# Patient Record
Sex: Female | Born: 1955 | Race: Black or African American | Hispanic: No | Marital: Married | Smoking: Never smoker
Health system: Southern US, Community
[De-identification: ages and names within clinical notes are randomized; demographics above are authoritative.]

## PROBLEM LIST (undated history)

## (undated) DIAGNOSIS — I10 Essential (primary) hypertension: Secondary | ICD-10-CM

## (undated) HISTORY — PX: ABDOMINAL HYSTERECTOMY: SHX81

## (undated) HISTORY — PX: CHOLECYSTECTOMY: SHX55

---

## 1993-01-10 HISTORY — PX: BREAST EXCISIONAL BIOPSY: SUR124

## 2004-03-26 ENCOUNTER — Ambulatory Visit: Payer: Self-pay | Admitting: Family Medicine

## 2005-06-08 ENCOUNTER — Ambulatory Visit: Payer: Self-pay

## 2006-06-14 ENCOUNTER — Ambulatory Visit: Payer: Self-pay

## 2006-06-16 ENCOUNTER — Ambulatory Visit: Payer: Self-pay

## 2006-12-13 ENCOUNTER — Ambulatory Visit: Payer: Self-pay

## 2007-06-13 ENCOUNTER — Ambulatory Visit: Payer: Self-pay

## 2008-11-04 ENCOUNTER — Ambulatory Visit: Payer: Self-pay

## 2009-12-09 ENCOUNTER — Ambulatory Visit: Payer: Self-pay

## 2011-01-25 ENCOUNTER — Ambulatory Visit: Payer: Self-pay

## 2012-10-01 ENCOUNTER — Ambulatory Visit: Payer: Self-pay

## 2014-06-19 ENCOUNTER — Encounter: Payer: Self-pay | Admitting: Emergency Medicine

## 2014-06-19 ENCOUNTER — Emergency Department
Admission: EM | Admit: 2014-06-19 | Discharge: 2014-06-19 | Disposition: A | Discharge status: Home / Self Care | Payer: Self-pay | Attending: Emergency Medicine | Admitting: Emergency Medicine

## 2014-06-19 DIAGNOSIS — I10 Essential (primary) hypertension: Secondary | ICD-10-CM | POA: Insufficient documentation

## 2014-06-19 DIAGNOSIS — Z7729 Contact with and (suspected ) exposure to other hazardous substances: Secondary | ICD-10-CM | POA: Insufficient documentation

## 2014-06-19 DIAGNOSIS — R51 Headache: Secondary | ICD-10-CM | POA: Insufficient documentation

## 2014-06-19 HISTORY — DX: Essential (primary) hypertension: I10

## 2014-06-19 LAB — CARBOXYHEMOGLOBIN
CARBOXYHEMOGLOBIN: 1.2 % — AB (ref 1.5–9.0)
METHEMOGLOBIN: 1.2 %
Total oxygen content: 63.4 mL/dL

## 2014-06-19 NOTE — ED Provider Notes (Signed)
[facility] Emergency Department Provider Note  ____________________________________________  Time seen: 1202  I have reviewed the triage vital signs and the nursing notes.   HISTORY  Chief Complaint Toxic Inhalation   HPI Autumn Robles is a 59 y.o. female is here today with complaint of carbon monoxide poisoning. She states that her apartment has vents that are not placed in right.Her alarm in her apartment and her neighbor above both went off. The fire department was called. They were told to go to the emergency room if any problems. Patient states that she has had "flulike symptoms" along with a headache. She has remained in her home during this time. Yesterday she states that they disconnected the gas but the vents have not been repaired. She denies smoking in the past. Headache is a dull frontal headache with no radiation. She also had some cold symptoms.   Past Medical History  Diagnosis Date  . Hypertension     There are no active problems to display for this patient.   Past Surgical History  Procedure Laterality Date  . Cholecystectomy    . Abdominal hysterectomy      No current outpatient prescriptions on file.  Allergies Review of patient's allergies indicates no known allergies.  No family history on file.  Social History History  Substance Use Topics  . Smoking status: Never Smoker   . Smokeless tobacco: Not on file  . Alcohol Use: No    Review of Systems Constitutional: No fever/chills Eyes: No visual changes. ENT: No sore throat. Cardiovascular: Denies chest pain. Respiratory: Denies shortness of breath. Gastrointestinal: No abdominal pain.  No nausea, no vomiting.  No diarrhea.  No constipation. Genitourinary: Negative for dysuria. Musculoskeletal: Negative for back pain. Skin: Negative for rash. Neurological: Positive for headaches, no focal weakness or numbness.  10-point ROS otherwise  negative.  ____________________________________________   PHYSICAL EXAM:  VITAL SIGNS: ED Triage Vitals  Enc Vitals Group     BP 06/19/14 1122 124/83 mmHg     Pulse Rate 06/19/14 1122 70     Resp 06/19/14 1122 18     Temp 06/19/14 1122 98.3 F (36.8 C)     Temp Source 06/19/14 1122 Oral     SpO2 06/19/14 1122 96 %     Weight 06/19/14 1122 210 lb (95.255 kg)     Height 06/19/14 1122 5\' 5"  (1.651 m)     Head Cir --      Peak Flow --      Pain Score --      Pain Loc --      Pain Edu? --      Excl. in GC? --     Constitutional: Alert and oriented. Well appearing and in no acute distress. Eyes: Conjunctivae are normal. PERRL. EOMI. Head: Atraumatic. Nose: No congestion/rhinnorhea. Mouth/Throat: Mucous membranes are moist.  Oropharynx non-erythematous. Neck: No stridor.  Supple Hematological/Lymphatic/Immunilogical: No cervical lymphadenopathy. Cardiovascular: Normal rate, regular rhythm. Grossly normal heart sounds.  Good peripheral circulation. Respiratory: Normal respiratory effort.  No retractions. Lungs CTAB. Gastrointestinal: Soft and nontender. No distention Musculoskeletal: No lower extremity tenderness nor edema.  No joint effusions. Neurologic:  Normal speech and language. No gross focal neurologic deficits are appreciated. Speech is normal. No gait instability. Skin:  Skin is warm, dry and intact. No rash noted. Psychiatric: Mood and affect are normal. Speech and behavior are normal.  ____________________________________________   LABS (all labs ordered are listed, but only abnormal results are displayed)  Labs  Reviewed  CARBOXYHEMOGLOBIN - Abnormal; Notable for the following:    Carboxyhemoglobin 1.2 (*)    All other components within normal limits   PROCEDURES  Procedure(s) performed: None  Critical Care performed: No  ____________________________________________   INITIAL IMPRESSION / ASSESSMENT AND PLAN / ED COURSE  Pertinent labs & imaging  results that were available during my care of the patient were reviewed by me and considered in my medical decision making (see chart for details).  Patient was started on 2 L of oxygen by nasal cannula.  After approximate 45 minutes she states that her headache was completely resolved. Discussed going home and opening up all the windows in her apartment to live fresh air. She states that she has a mother place to stay until her vents are adequately repaired. ____________________________________________   FINAL CLINICAL IMPRESSION(S) / ED DIAGNOSES  Final diagnoses:  Exposure to carbon monoxide      Tommi Rumps, PA-C 06/19/14 1541  Phineas Semen, MD 06/19/14 [phone]

## 2014-06-19 NOTE — ED Notes (Signed)
Pt concerned of carbon monoxide poisoning bc the apartment she stays in has recently tested high for CM. Pt states she has been feeling "flu like symptoms" for about a week now.

## 2014-06-19 NOTE — Discharge Instructions (Signed)
Carbon Monoxide Poisoning Carbon monoxide poisoning is sickness from breathing in carbon monoxide gas. This is an emergency. You need to get medical help right away.  HOME CARE Do not return to the area where you breathed in the gas. The area must be completely aired out (ventilated) before it is safe to return. To prevent sickness in the future:  Put a carbon monoxide detector in your home.  Have all gas stoves and furnaces checked once a year.  Clean all fireplace chimneys and flues at least once a year.  Have the exhaust system in your car checked once a year.  Never keep a car's motor running in a closed garage.  Never sleep in a car with the motor running.  Make sure all rooms that are heated with gas, coal, charcoal, or wood are aired out well. GET HELP RIGHT AWAY IF: You think a person has breathed in carbon monoxide gas. Remove the person from the area right away. Call your local emergency services (911 in [place] Begin rescue breathing if you cannot wake up the person (unconscious). MAKE SURE YOU:  Understand these instructions.  Will watch your condition.  Will get help right away if you are not doing well or get worse. Document Released: 06/28/2011 Document Reviewed: 06/28/2011 [facility] Patient Information 2015 [place], [place]. This information is not intended to replace advice given to you by your health care provider. Make sure you discuss any questions you have with your health care provider.   OPEN THE WINDOWS IN YOUR APARTMENT TO GET LOTS OF FRESH AIR IN YOUR ROOMS FOLLOW UP WITH YOUR DOCTOR IF ANY CONTINUED PROBLEMS

## 2015-01-26 ENCOUNTER — Ambulatory Visit: Payer: Self-pay | Attending: Oncology | Admitting: *Deleted

## 2015-01-26 ENCOUNTER — Encounter: Payer: Self-pay | Admitting: *Deleted

## 2015-01-26 ENCOUNTER — Other Ambulatory Visit: Payer: Self-pay | Admitting: *Deleted

## 2015-01-26 ENCOUNTER — Ambulatory Visit
Admission: RE | Admit: 2015-01-26 | Discharge: 2015-01-26 | Disposition: A | Discharge status: Home / Self Care | Payer: Self-pay | Source: Ambulatory Visit | Attending: Oncology | Admitting: Oncology

## 2015-01-26 VITALS — BP 111/75 | HR 72 | Temp 97.5°F | Ht 66.14 in | Wt 219.4 lb

## 2015-01-26 DIAGNOSIS — N63 Unspecified lump in unspecified breast: Secondary | ICD-10-CM

## 2015-01-26 DIAGNOSIS — Z0000 Encounter for general adult medical examination without abnormal findings: Secondary | ICD-10-CM

## 2015-01-26 NOTE — Patient Instructions (Signed)
Gave patient hand-out, Women Staying Healthy, Active and Well from [facility], with education on breast health, pap smears, heart and colon health. 

## 2015-01-26 NOTE — Progress Notes (Signed)
Subjective:     Patient ID: Autumn Robles, female   DOB: 1956/01/04, 60 y.o.   MRN: [id number]  HPI   Review of Systems     Objective:   Physical Exam  Pulmonary/Chest: Right breast exhibits no inverted nipple, no mass, no nipple discharge, no skin change and no tenderness. Left breast exhibits no inverted nipple, no mass, no nipple discharge, no skin change and no tenderness. Breasts are symmetrical.    Scar from previous left breast biopsy       Assessment:     60 [facility] Black [facility]  Clinical breast exam unremarkable.  Taught self breast awareness.  Patient has been screened for eligibility.  She does not have any insurance, Medicare or Medicaid.  She also meets financial eligibility.  Hand-out given on the Affordable Care Act.     Plan:     Screening mammogram ordered.  Will follow - up per [facility] protocol.

## 2015-02-12 ENCOUNTER — Ambulatory Visit
Admission: RE | Admit: 2015-02-12 | Discharge: 2015-02-12 | Disposition: A | Discharge status: Home / Self Care | Payer: Self-pay | Source: Ambulatory Visit | Attending: Oncology | Admitting: Oncology

## 2015-02-12 DIAGNOSIS — N63 Unspecified lump in unspecified breast: Secondary | ICD-10-CM

## 2015-02-16 ENCOUNTER — Encounter: Payer: Self-pay | Admitting: *Deleted

## 2015-02-16 ENCOUNTER — Other Ambulatory Visit: Payer: Self-pay | Admitting: *Deleted

## 2015-02-16 DIAGNOSIS — N63 Unspecified lump in unspecified breast: Secondary | ICD-10-CM

## 2015-02-16 NOTE — Progress Notes (Signed)
Letter mailed to inform patient of her appointment for her 6 month follow-up mammogram on 08/18/15 @ 9:20 at [place].  HSIS to [place].

## 2015-06-12 ENCOUNTER — Emergency Department
Admission: EM | Admit: 2015-06-12 | Discharge: 2015-06-12 | Disposition: A | Discharge status: Home / Self Care | Payer: Self-pay | Attending: Emergency Medicine | Admitting: Emergency Medicine

## 2015-06-12 ENCOUNTER — Encounter: Payer: Self-pay | Admitting: Emergency Medicine

## 2015-06-12 DIAGNOSIS — I10 Essential (primary) hypertension: Secondary | ICD-10-CM | POA: Insufficient documentation

## 2015-06-12 DIAGNOSIS — M7521 Bicipital tendinitis, right shoulder: Secondary | ICD-10-CM | POA: Insufficient documentation

## 2015-06-12 MED ORDER — MELOXICAM 15 MG PO TABS: 15.0000 mg | ORAL_TABLET | Freq: Every day | ORAL | Status: AC

## 2015-06-12 MED ORDER — TRAMADOL HCL 50 MG PO TABS: 50.0000 mg | ORAL_TABLET | Freq: Four times a day (QID) | ORAL | Status: AC | PRN

## 2015-06-12 MED ORDER — PREDNISONE 10 MG (21) PO TBPK: ORAL_TABLET | ORAL | Status: DC

## 2015-06-12 MED ORDER — PREDNISONE 20 MG PO TABS
ORAL_TABLET | ORAL | Status: AC
  Administered 2015-06-12: 60 mg via ORAL
  Filled 2015-06-12: qty 3

## 2015-06-12 MED ORDER — PREDNISONE 20 MG PO TABS
60.0000 mg | ORAL_TABLET | Freq: Once | ORAL | Status: AC
  Administered 2015-06-12: 60 mg via ORAL

## 2015-06-12 NOTE — Discharge Instructions (Signed)
Bicipital Tendonitis Bicipital tendonitis refers to redness, soreness, and swelling (inflammation) or irritation of the bicep tendon. The biceps muscle is located between the elbow and shoulder of the inner arm. The tendon heads, similar to pieces of rope, connect the bicep muscle to the shoulder socket. They are called short head and long head tendons. When tendonitis occurs, the long head tendon is inflamed and swollen, and may be thickened or partially torn.  Bicipital tendonitis can occur with other problems as well, such as arthritis in the shoulder or acromioclavicular joints, tears in the tendons, or other rotator cuff problems.  CAUSES  Overuse of of the arms for overhead activities is the major cause of tendonitis. Many athletes, such as swimmers, baseball players, and tennis players are prone to bicipital tendonitis. Jobs that require manual labor or routine chores, especially chores involving overhead activities can result in overuse and tendonitis. SYMPTOMS Symptoms may include:  Pain in and around the front of the shoulder. Pain may be worse with overhead motion.  Pain or aching that radiates down the arm.  Clicking or shifting sensations in the shoulder. DIAGNOSIS Your caregiver may perform the following:  Physical exam and tests of the biceps and shoulder to observe range of motion, strength, and stability.  X-rays or magnetic resonance imaging (MRI) to confirm the diagnosis. In most common cases, these tests are not necessary. Since other problems may exist in the shoulder or rotator cuff, additional tests may be recommended. TREATMENT Treatment may include the following:  Medications  Your caregiver may prescribe over-the-counter pain relievers.  Steroid injections, such as cortisone, may be recommended. These may help to reduce inflammation and pain.  Physical Therapy - Your caregiver may recommend gentle exercises with the arm. These can help restore strength and range  of motion. They may be done at home or with a physical therapist's supervision and input.  Surgery - Arthroscopic or open surgery sometimes is necessary. Surgery may include:  Reattachment or repair of the tendon at the shoulder socket.  Removal of the damaged section of the tendon.  Anchoring the tendon to a different area of the shoulder (tenodesis). HOME CARE INSTRUCTIONS   Avoid overhead motion of the affected arm or any other motion that causes pain.  Take medication for pain as directed. Do not take these for more than 3 weeks, unless directed to do so by your caregiver.  Ice the affected area for 20 minutes at a time, 3-4 times per day. Place a towel on the skin over the painful area and the ice or cold pack over the towel. Do not place ice directly on the skin.  Perform gentle exercises at home as directed. These will increase strength and flexibility. PREVENTION  Modify your activities as much as possible to protect your arm. A physical therapist or sports medicine physician can help you understand options for safe motion.  Avoid repetitive overhead pulling, lifting, reaching, and throwing until your caregiver tells you it is ok to resume these activities. SEEK MEDICAL CARE IF:  Your pain worsens.  You have difficulty moving the affected arm.  You have trouble performing any of the self-care instructions. MAKE SURE YOU:   Understand these instructions.  Will watch your condition.  Will get help right away if you are not doing well or get worse.   This information is not intended to replace advice given to you by your health care provider. Make sure you discuss any questions you have with your health care provider.     Document Released: 01/29/2010 Document Revised: 03/21/2011 Document Reviewed: 07/16/2014 Elsevier Interactive Patient Education 2016 [employer]  

## 2015-06-12 NOTE — ED Provider Notes (Signed)
[facility] Emergency Department Provider Note ____________________________________________  Time seen: Approximately 9:17 PM  I have reviewed the triage vital signs and the nursing notes.   HISTORY  Chief Complaint Extremity Pain and Arm Injury    HPI Autumn Robles is a 60 y.o. female who presents to the emergency department for evaluation of right upper arm pain. Certain movements cause excruciating pain. She states that she is not taking any over-the-counter medications for pain. She states that the pain is in the middle of the right upper arm and denies radiation, numbness, tingling.  Past Medical History  Diagnosis Date  . Hypertension     There are no active problems to display for this patient.   Past Surgical History  Procedure Laterality Date  . Cholecystectomy    . Abdominal hysterectomy    . Breast biopsy Left     benign    Current Outpatient Rx  Name  Route  Sig  Dispense  Refill  . meloxicam (MOBIC) 15 MG tablet   Oral   Take 1 tablet (15 mg total) by mouth daily.   30 tablet   0   . predniSONE (STERAPRED UNI-PAK 21 TAB) 10 MG (21) TBPK tablet      Take 6 tablets on day 1 Take 5 tablets on day 2 Take 4 tablets on day 3 Take 3 tablets on day 4 Take 2 tablets on day 5 Take 1 tablet on day 6   21 tablet   0   . traMADol (ULTRAM) 50 MG tablet   Oral   Take 1 tablet (50 mg total) by mouth every 6 (six) hours as needed.   12 tablet   0     Allergies Review of patient's allergies indicates no known allergies.  History reviewed. No pertinent family history.  Social History Social History  Substance Use Topics  . Smoking status: Never Smoker   . Smokeless tobacco: None  . Alcohol Use: No    Review of Systems Constitutional: No recent illness. Cardiovascular: Denies chest pain or palpitations. Respiratory: Denies shortness of breath. Musculoskeletal: Pain in Right upper arm Skin: Negative for rash, wound,  lesion. Neurological: Negative for focal weakness or numbness.  ____________________________________________   PHYSICAL EXAM:  VITAL SIGNS: ED Triage Vitals  Enc Vitals Group     BP 06/12/15 1922 140/81 mmHg     Pulse Rate 06/12/15 1922 74     Resp 06/12/15 1922 18     Temp 06/12/15 1922 98.2 F (36.8 C)     Temp Source 06/12/15 1922 Oral     SpO2 06/12/15 1922 97 %     Weight 06/12/15 1922 215 lb (97.523 kg)     Height 06/12/15 1922 5\' 5"  (1.651 m)     Head Cir --      Peak Flow --      Pain Score 06/12/15 1922 0     Pain Loc --      Pain Edu? --      Excl. in GC? --     Constitutional: Alert and oriented. Well appearing and in no acute distress. Eyes: Conjunctivae are normal. EOMI. Head: Atraumatic. Neck: No stridor.  Respiratory: Normal respiratory effort.   Musculoskeletal: Tenderness is noted in the proximal biceps of the right arm with full extension of the arm, external and internal rotation of the arm. Normal range of motion of the shoulder, elbow, and wrist. Neurologic:  Normal speech and language. No gross focal neurologic deficits are  appreciated. Speech is normal. No gait instability. Skin:  Skin is warm, dry and intact. Atraumatic. Psychiatric: Mood and affect are normal. Speech and behavior are normal.  ____________________________________________   LABS (all labs ordered are listed, but only abnormal results are displayed)  Labs Reviewed - No data to display ____________________________________________  RADIOLOGY  Not indicated ____________________________________________   PROCEDURES  Procedure(s) performed: None   ____________________________________________   INITIAL IMPRESSION / ASSESSMENT AND PLAN / ED COURSE  Pertinent labs & imaging results that were available during my care of the patient were reviewed by me and considered in my medical decision making (see chart for details).  Patient was given prescriptions for meloxicam,  tramadol, and prednisone. She was encouraged to follow up with orthopedics for symptoms that are not improving over the week. She was encouraged to return to the emergency department for symptoms that change or worsen if she is unable schedule an appointment. ____________________________________________   FINAL CLINICAL IMPRESSION(S) / ED DIAGNOSES  Final diagnoses:  Biceps tendonitis, right       Chinita Pester, FNP 06/12/15 2120  Loleta Rose, MD 06/12/15 2222

## 2015-06-12 NOTE — ED Notes (Signed)
Pt ambulatory to triage without difficulty c/o pain in right upper arm indicate to bicep with specific movements (10/10), but 0/10 pain with no movement or activity.  Pain reported for the last month, pt denies specific event that started pain.    Pt denies further s/sx, reports hx of HTN and compliancy.

## 2015-08-18 ENCOUNTER — Ambulatory Visit
Admission: RE | Admit: 2015-08-18 | Discharge: 2015-08-18 | Disposition: A | Discharge status: Home / Self Care | Payer: Self-pay | Source: Ambulatory Visit | Attending: Oncology | Admitting: Oncology

## 2015-08-18 DIAGNOSIS — N63 Unspecified lump in unspecified breast: Secondary | ICD-10-CM

## 2015-08-25 ENCOUNTER — Encounter: Payer: Self-pay | Admitting: *Deleted

## 2015-08-25 NOTE — Progress Notes (Signed)
Letter mailed from [facility] to inform patient of her normal mammogram results.  Patient is to follow-up with annual screening in 6 months.  HSIS to [place]. 

## 2016-03-14 ENCOUNTER — Encounter: Payer: Self-pay | Admitting: *Deleted

## 2016-03-14 ENCOUNTER — Encounter (INDEPENDENT_AMBULATORY_CARE_PROVIDER_SITE_OTHER): Payer: Self-pay

## 2016-03-14 ENCOUNTER — Ambulatory Visit
Admission: RE | Admit: 2016-03-14 | Discharge: 2016-03-14 | Disposition: A | Discharge status: Home / Self Care | Payer: Self-pay | Source: Ambulatory Visit | Attending: Oncology | Admitting: Oncology

## 2016-03-14 ENCOUNTER — Ambulatory Visit: Payer: Self-pay | Attending: Oncology | Admitting: *Deleted

## 2016-03-14 VITALS — BP 144/88 | HR 76 | Temp 97.8°F | Ht 66.54 in | Wt 233.8 lb

## 2016-03-14 DIAGNOSIS — Z0000 Encounter for general adult medical examination without abnormal findings: Secondary | ICD-10-CM

## 2016-03-14 NOTE — Patient Instructions (Signed)
Gave patient hand-out, Women Staying Healthy, Active and Well from [facility], with education on breast health, pap smears, heart and colon health. 

## 2016-03-14 NOTE — Progress Notes (Signed)
Subjective:     Patient ID: Autumn Robles, female   DOB: 12-Jun-1955, 61 y.o.   MRN: [id number]  HPI   Review of Systems     Objective:   Physical Exam  Pulmonary/Chest: Right breast exhibits no inverted nipple, no mass, no nipple discharge, no skin change and no tenderness. Left breast exhibits no inverted nipple, no mass, no nipple discharge, no skin change and no tenderness. Breasts are symmetrical.         Assessment:     61 [facility] Black [facility]  Clinical breast exam unremarkable.  Taught self breast awareness.  Patient with history of hysterectomy.  No pap per protocol. Patient has been screened for eligibility.  She does not have any insurance, Medicare or Medicaid.  She also meets financial eligibility.  Hand-out given on the Affordable Care Act.    Plan:     Screening mammogram ordered.  Will follow up per [facility] protocol.

## 2016-03-30 ENCOUNTER — Encounter: Payer: Self-pay | Admitting: *Deleted

## 2016-03-30 NOTE — Progress Notes (Signed)
Letter mailed from [facility] to inform patient of her normal mammogram results.  Patient is to follow-up with annual screening in one year.  HSIS to [place]. 

## 2017-06-04 IMAGING — MG MM DIGITAL SCREENING BILAT W/ CAD
5 series · 5 of 5 positions shown · non-contrast
Comparison: Previous exam(s).

CLINICAL DATA: Screening.

EXAM:
DIGITAL SCREENING BILATERAL MAMMOGRAM WITH CAD

[L MLO]
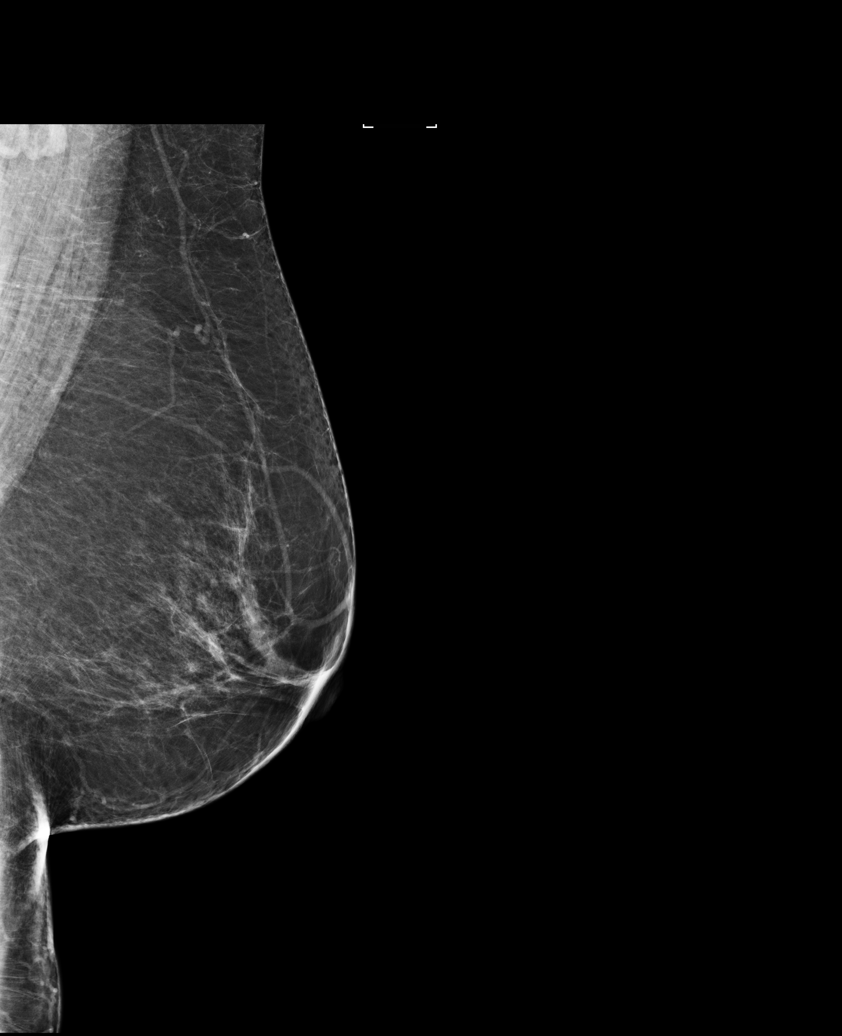

[R CC]
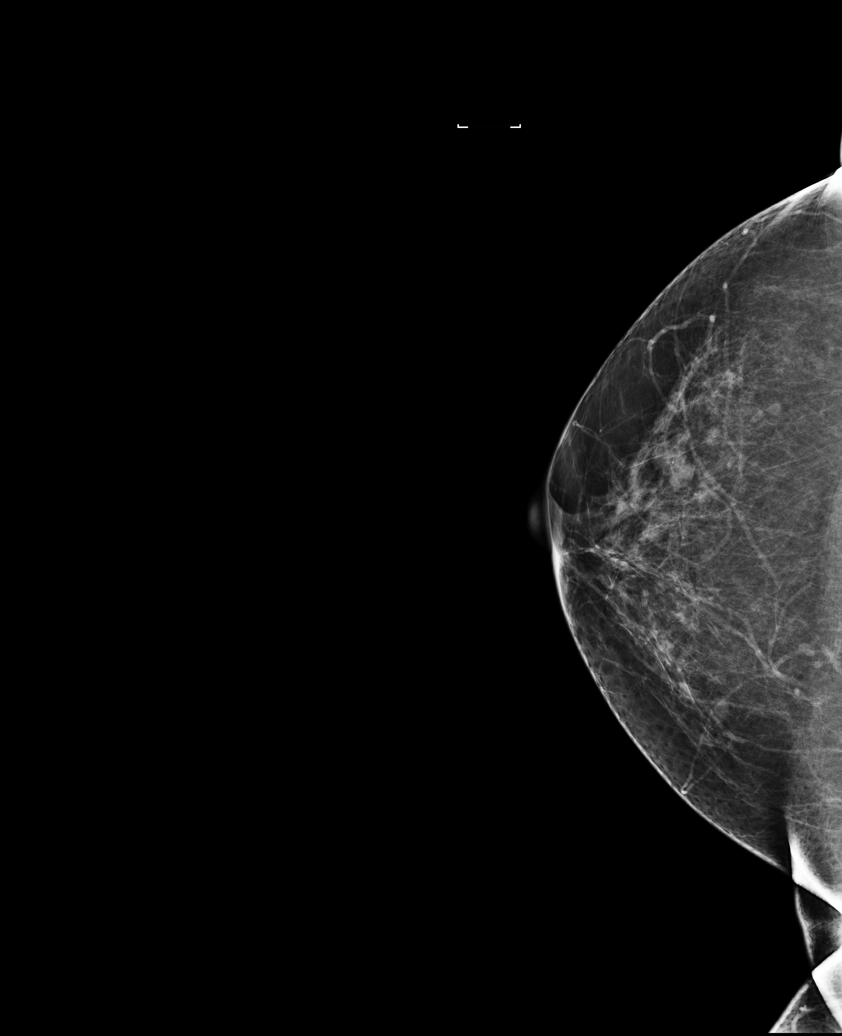

[R MLO (1 of 2)]
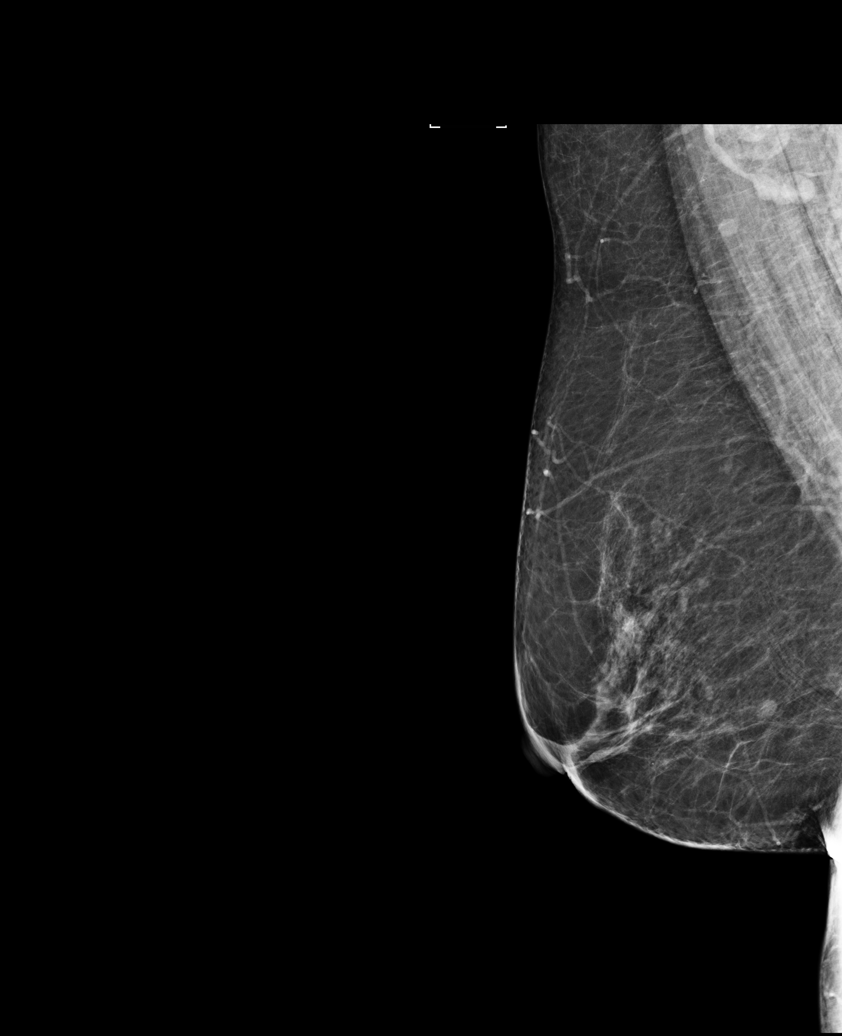

[L CC]
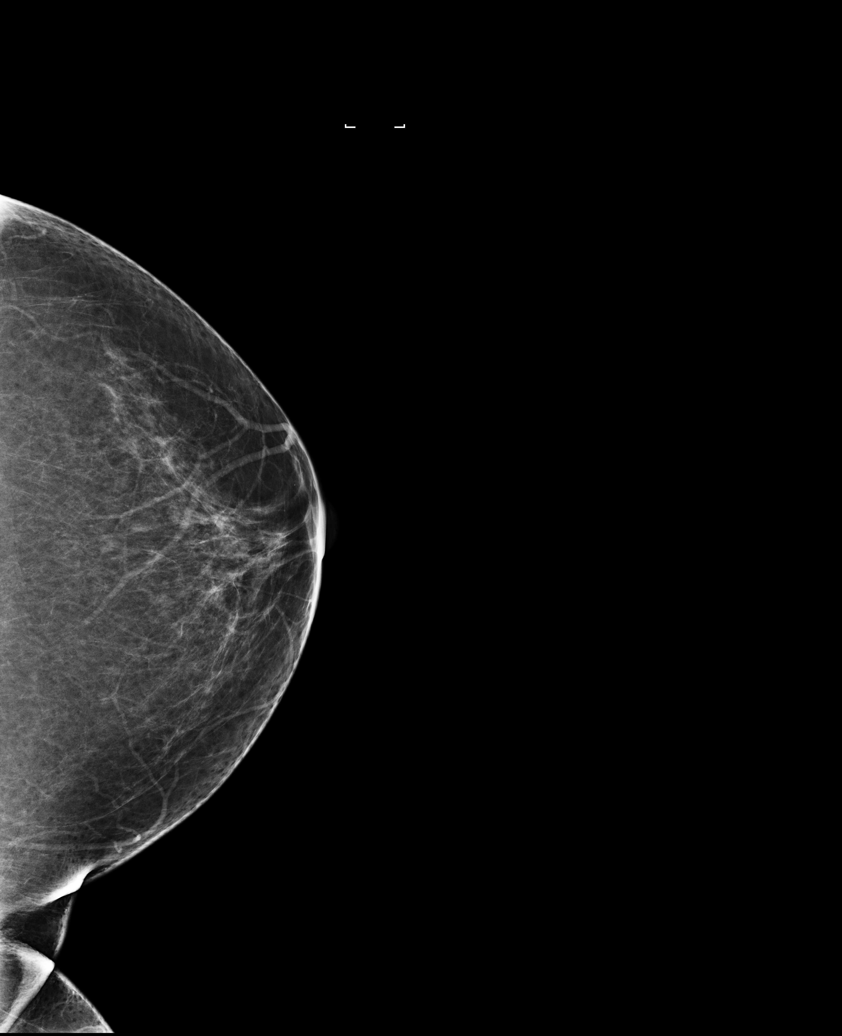

[R MLO (2 of 2)]
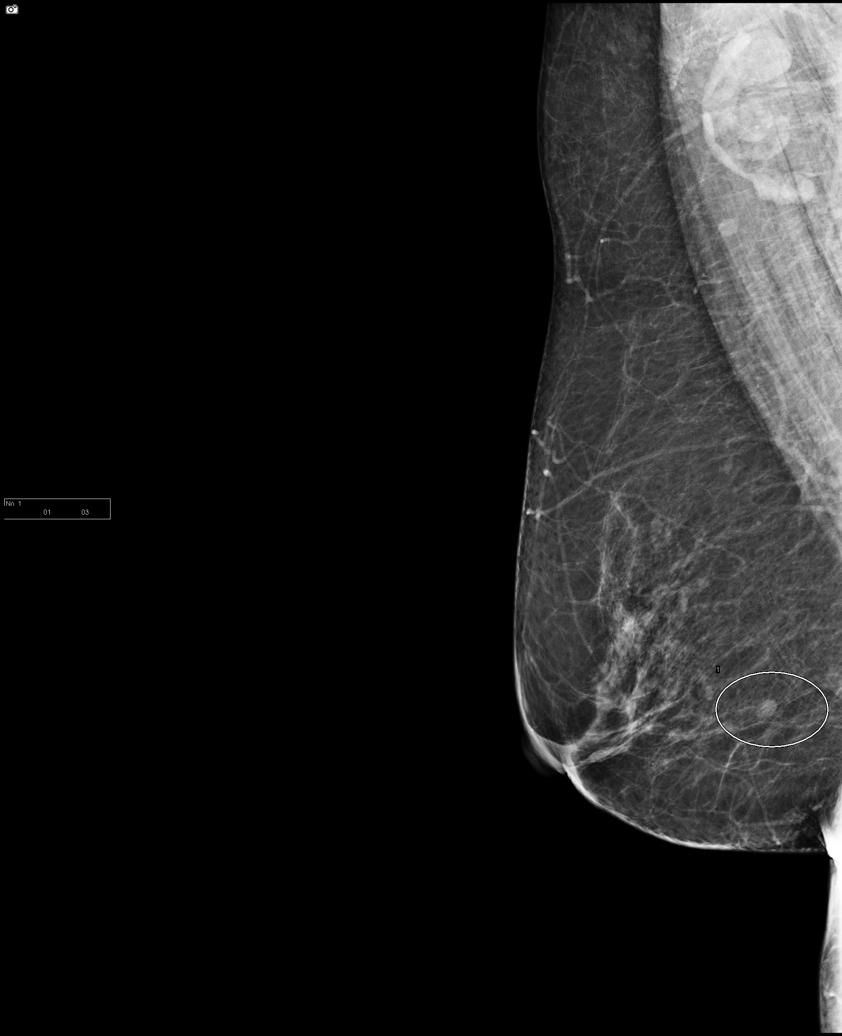

[5 of 5 positions shown; findings below may reference images not displayed]

ACR Breast Density Category b: There are scattered areas of
fibroglandular density.
FINDINGS: In the right breast, a possible mass warrants further evaluation. In
the left breast, no findings suspicious for malignancy. Images were
processed with CAD.
IMPRESSION: Further evaluation is suggested for possible mass in the right
breast.

RECOMMENDATION:
Diagnostic mammogram and possibly ultrasound of the right breast.
(Code:[id number])

The patient will be contacted regarding the findings, and additional
imaging will be scheduled.

BI-RADS CATEGORY  0: Incomplete. Need additional imaging evaluation
and/or prior mammograms for comparison.

## 2018-05-07 ENCOUNTER — Ambulatory Visit: Payer: Self-pay

## 2019-03-30 ENCOUNTER — Ambulatory Visit: Payer: Self-pay | Attending: Internal Medicine

## 2019-03-30 DIAGNOSIS — Z23 Encounter for immunization: Secondary | ICD-10-CM

## 2019-03-30 NOTE — Progress Notes (Signed)
   Covid-19 Vaccination Clinic  Name:  Autumn Robles    MRN: [id number] DOB: Sep 24, 1955  03/30/2019  [name] was observed post Covid-19 immunization for 15 minutes without incident. She was provided with Vaccine Information Sheet and instruction to access the V-Safe system.   [name] was instructed to call 911 with any severe reactions post vaccine: Marland Kitchen Difficulty breathing  . Swelling of face and throat  . A fast heartbeat  . A bad rash all over body  . Dizziness and weakness   Immunizations Administered    Name Date Dose VIS Date Route   Pfizer COVID-19 Vaccine 03/30/2019  9:16 AM 0.3 mL 12/21/2018 Intramuscular   Manufacturer: [employer], [employer]   Lot: [id number]   NDC: [address]-7

## 2019-04-23 ENCOUNTER — Ambulatory Visit: Payer: Self-pay | Attending: Internal Medicine

## 2019-04-23 DIAGNOSIS — Z23 Encounter for immunization: Secondary | ICD-10-CM

## 2019-04-23 NOTE — Progress Notes (Signed)
   Covid-19 Vaccination Clinic  Name:  Autumn Robles    MRN: [id number] DOB: 06-Oct-1955  04/23/2019  [name] was observed post Covid-19 immunization for 15 minutes without incident. She was provided with Vaccine Information Sheet and instruction to access the V-Safe system.   [name] was instructed to call 911 with any severe reactions post vaccine: Marland Kitchen Difficulty breathing  . Swelling of face and throat  . A fast heartbeat  . A bad rash all over body  . Dizziness and weakness   Immunizations Administered    Name Date Dose VIS Date Route   Pfizer COVID-19 Vaccine 04/23/2019 12:25 PM 0.3 mL 12/21/2018 Intramuscular   Manufacturer: [employer], [employer]   Lot: [id number]   NDC: [address]-8

## 2019-11-05 ENCOUNTER — Ambulatory Visit (LOCAL_COMMUNITY_HEALTH_CENTER): Payer: Self-pay

## 2019-11-05 ENCOUNTER — Other Ambulatory Visit: Payer: Self-pay

## 2019-11-05 DIAGNOSIS — Z23 Encounter for immunization: Secondary | ICD-10-CM

## 2019-12-03 ENCOUNTER — Ambulatory Visit
Admission: RE | Admit: 2019-12-03 | Discharge: 2019-12-03 | Disposition: A | Discharge status: Home / Self Care | Payer: Self-pay | Source: Ambulatory Visit | Attending: Oncology | Admitting: Oncology

## 2019-12-03 ENCOUNTER — Encounter: Payer: Self-pay | Admitting: *Deleted

## 2019-12-03 ENCOUNTER — Ambulatory Visit: Payer: Self-pay | Attending: Oncology | Admitting: *Deleted

## 2019-12-03 ENCOUNTER — Other Ambulatory Visit: Payer: Self-pay

## 2019-12-03 VITALS — BP 121/80 | HR 72 | Temp 98.2°F | Ht 67.0 in | Wt 213.3 lb

## 2019-12-03 DIAGNOSIS — Z0000 Encounter for general adult medical examination without abnormal findings: Secondary | ICD-10-CM

## 2019-12-03 NOTE — Progress Notes (Signed)
  Subjective:     Patient ID: Autumn Robles, female   DOB: 11/28/1955, 64 y.o.   MRN: [id number]  HPI   [facility] Medical History Record - 12/02/19 1640      Breast History   Screening cycle New    Provider ([facility]    Initial Mammogram 12/03/19    Last Mammogram Annual    Last Mammogram Date 03/14/16    Provider (Mammogram)  [facility]    Recent Breast Symptoms None      Breast Cancer History   Breast Cancer History No personal or family history      Previous History of Breast Problems   Breast Surgery or Biopsy Left   benign   Breast Implants N/A    BSE Done Monthly      Gynecological/Obstetrical History   LMP --   1998   Is there any chance that the client could be pregnant?  No    Age at menarche 40    PAP smear history Annually    Date of last PAP  --   hysterectomy 1998   Age at first live birth 68    Breast fed children No    DES Exposure No    Cervical, Uterine or Ovarian cancer No    Family history of Cervial, Uterine or Ovarian cancer No    Hysterectomy Yes    Cervix removed Yes    Ovaries removed No    Laser/Cryosurgery No    Current method of birth control None    Current method of Estrogen/Hormone replacement None    Smoking history None             Review of Systems     Objective:   Physical Exam Chest:     Breasts: Breasts are symmetrical.        Right: No swelling, bleeding, inverted nipple, mass, nipple discharge, skin change or tenderness.        Left: No swelling, bleeding, inverted nipple, mass, nipple discharge, skin change or tenderness.  Lymphadenopathy:     Upper Body:     Right upper body: No supraclavicular or axillary adenopathy.     Left upper body: No supraclavicular or axillary adenopathy.        Assessment:     64 year old female returns to [facility] for annual screening.  Clinical breast unremarkable.  Taught self breast awareness.  Patient has a history of hysterectomy.  Pap omitted per protocol.  Patient has  been screened for eligibility.  She does not have any insurance, Medicare or Medicaid.  She also meets financial eligibility.   Risk Assessment    Risk Scores      12/03/2019   Last edited by: Scarlett Presto, RN   5-year risk: 1.6 %   Lifetime risk: 6.1 %            Plan:     Screening mammogram ordered.  Will follow up per [facility] protocol.

## 2019-12-12 ENCOUNTER — Encounter: Payer: Self-pay | Admitting: *Deleted

## 2019-12-12 NOTE — Progress Notes (Signed)
Letter mailed from [facility] to inform patient of her normal mammogram results.  Patient is to follow-up with annual screening in one year. 

## 2020-09-29 ENCOUNTER — Other Ambulatory Visit: Payer: Self-pay | Admitting: Primary Care

## 2020-09-29 DIAGNOSIS — Z1231 Encounter for screening mammogram for malignant neoplasm of breast: Secondary | ICD-10-CM

## 2020-12-07 ENCOUNTER — Other Ambulatory Visit: Payer: Self-pay

## 2020-12-07 ENCOUNTER — Ambulatory Visit
Admission: RE | Admit: 2020-12-07 | Discharge: 2020-12-07 | Disposition: A | Discharge status: Home / Self Care | Payer: Medicare Other | Source: Ambulatory Visit | Attending: Primary Care | Admitting: Primary Care

## 2020-12-07 DIAGNOSIS — Z1231 Encounter for screening mammogram for malignant neoplasm of breast: Secondary | ICD-10-CM | POA: Insufficient documentation

## 2021-11-15 ENCOUNTER — Encounter: Payer: Self-pay | Admitting: Primary Care

## 2022-04-11 ENCOUNTER — Ambulatory Visit
Admission: EM | Admit: 2022-04-11 | Discharge: 2022-04-11 | Disposition: A | Discharge status: Home / Self Care | Payer: 59

## 2022-04-11 DIAGNOSIS — R051 Acute cough: Secondary | ICD-10-CM

## 2022-04-11 DIAGNOSIS — J069 Acute upper respiratory infection, unspecified: Secondary | ICD-10-CM

## 2022-04-11 MED ORDER — BENZONATATE 100 MG PO CAPS: 200.0000 mg | ORAL_CAPSULE | Freq: Three times a day (TID) | ORAL | 0 refills | Status: DC

## 2022-04-11 MED ORDER — IPRATROPIUM BROMIDE 0.06 % NA SOLN: 2.0000 | Freq: Four times a day (QID) | NASAL | 12 refills | Status: AC

## 2022-04-11 MED ORDER — AMOXICILLIN-POT CLAVULANATE 875-125 MG PO TABS: 1.0000 | ORAL_TABLET | Freq: Two times a day (BID) | ORAL | 0 refills | Status: DC

## 2022-04-11 MED ORDER — PROMETHAZINE-DM 6.25-15 MG/5ML PO SYRP: 5.0000 mL | ORAL_SOLUTION | Freq: Four times a day (QID) | ORAL | 0 refills | Status: DC | PRN

## 2022-04-11 NOTE — Discharge Instructions (Signed)
The Augmentin twice daily with food for 10 days for treatment of your URI. ° °Perform sinus irrigation 2-3 times a day with a NeilMed sinus rinse kit and distilled water.  Do not use tap water. ° °You can use plain over-the-counter Mucinex every 6 hours to break up the stickiness of the mucus so your body can clear it. ° °Increase your oral fluid intake to thin out your mucus so that is also able for your body to clear more easily. ° °Take an over-the-counter probiotic, such as Culturelle-align-activia, 1 hour after each dose of antibiotic to prevent diarrhea. ° °Use the Atrovent nasal spray, 2 squirts in each nostril every 6 hours, as needed for runny nose and postnasal drip. ° °Use the Tessalon Perles every 8 hours during the day.  Take them with a small sip of water.  They may give you some numbness to the base of your tongue or a metallic taste in your mouth, this is normal. ° °Use the Promethazine DM cough syrup at bedtime for cough and congestion.  It will make you drowsy so do not take it during the day. ° °If you develop any new or worsening symptoms return for reevaluation or see your primary care provider.  °

## 2022-04-11 NOTE — ED Triage Notes (Addendum)
Pt c/o sore throat & congestion onset x1 week ago. Pt states she has been taking mucinex for sxs,is helping some. Pt reports negative home covid test last Sunday

## 2022-04-11 NOTE — ED Provider Notes (Signed)
[facility]    CSN: [id number] Arrival date & time: 04/11/22  1024      History   Chief Complaint Chief Complaint  Patient presents with   Sore Throat   Nasal Congestion    HPI Autumn Robles is a 67 y.o. female.   HPI  67 year old female with significant past medical history for hypertension presents for evaluation of sore throat and congestion x 1 week.  She has been using over-the-counter Mucinex for her symptoms with some help.  She took a home COVID test that was negative.  Past Medical History:  Diagnosis Date   Hypertension     There are no problems to display for this patient.   Past Surgical History:  Procedure Laterality Date   ABDOMINAL HYSTERECTOMY     BREAST EXCISIONAL BIOPSY Left 1995   benign   CHOLECYSTECTOMY      OB History   No obstetric history on file.      Home Medications    Prior to Admission medications   Medication Sig Start Date End Date Taking? Authorizing Provider  amoxicillin-clavulanate (AUGMENTIN) 875-125 MG tablet Take 1 tablet by mouth every 12 (twelve) hours for 10 days. 04/11/22 04/21/22 Yes Margarette Canada, NP  benzonatate (TESSALON) 100 MG capsule Take 2 capsules (200 mg total) by mouth every 8 (eight) hours. 04/11/22  Yes Margarette Canada, NP  hydrochlorothiazide (HYDRODIURIL) 25 MG tablet Take 25 mg by mouth daily.   Yes [provider]  ipratropium (ATROVENT) 0.06 % nasal spray Place 2 sprays into both nostrils 4 (four) times daily. 04/11/22  Yes Margarette Canada, NP  losartan (COZAAR) 25 MG tablet Take 25 mg by mouth daily.   Yes [provider]  promethazine-dextromethorphan (PROMETHAZINE-DM) 6.25-15 MG/5ML syrup Take 5 mLs by mouth 4 (four) times daily as needed. 04/11/22  Yes Margarette Canada, NP  meloxicam (MOBIC) 15 MG tablet Take 1 tablet (15 mg total) by mouth daily. 06/12/15   Triplett, Cari B, FNP  predniSONE (STERAPRED UNI-PAK 21 TAB) 10 MG (21) TBPK tablet Take 6 tablets on day 1 Take 5 tablets on day  2 Take 4 tablets on day 3 Take 3 tablets on day 4 Take 2 tablets on day 5 Take 1 tablet on day 6 06/12/15   Triplett, Cari B, FNP  traMADol (ULTRAM) 50 MG tablet Take 1 tablet (50 mg total) by mouth every 6 (six) hours as needed. 06/12/15   Victorino Dike, FNP    Family History Family History  Problem Relation Age of Onset   Breast cancer Neg Hx     Social History Social History   Tobacco Use   Smoking status: Never  Substance Use Topics   Alcohol use: No   Drug use: No     Allergies   Patient has no known allergies.   Review of Systems Review of Systems  Constitutional:  Negative for fever.  HENT:  Positive for congestion, rhinorrhea and sore throat. Negative for ear pain.   Respiratory:  Positive for cough. Negative for shortness of breath and wheezing.      Physical Exam Triage Vital Signs ED Triage Vitals  Enc Vitals Group     BP      Pulse      Resp      Temp      Temp src      SpO2      Weight      Height      Head Circumference  Peak Flow      Pain Score      Pain Loc      Pain Edu?      Excl. in [place]?    No data found.  Updated Vital Signs BP 129/82 (BP Location: Right Arm)   Pulse 72   Temp 98.3 F (36.8 C) (Oral)   Ht 5\' 5"  (1.651 m)   Wt 217 lb (98.4 kg)   SpO2 99%   BMI 36.11 kg/m   Visual Acuity Right Eye Distance:   Left Eye Distance:   Bilateral Distance:    Right Eye Near:   Left Eye Near:    Bilateral Near:     Physical Exam Vitals and nursing note reviewed.  Constitutional:      Appearance: Normal appearance. She is not ill-appearing.  HENT:     Head: Normocephalic and atraumatic.     Nose: Congestion and rhinorrhea present.     Comments: His mucosa is erythematous and edematous with yellow discharge in both nares.  No tenderness to compression of frontal or maxillary sinuses bilaterally.    Mouth/Throat:     Mouth: Mucous membranes are moist.     Pharynx: Oropharynx is clear. Posterior oropharyngeal erythema  present. No oropharyngeal exudate.     Comments: Tonsillar pillars are unremarkable.  Posterior oropharynx does demonstrate mild erythema and injection with yellow postnasal drip. Cardiovascular:     Rate and Rhythm: Normal rate and regular rhythm.     Pulses: Normal pulses.     Heart sounds: Normal heart sounds. No murmur heard.    No friction rub. No gallop.  Pulmonary:     Effort: Pulmonary effort is normal.     Breath sounds: Normal breath sounds. No wheezing, rhonchi or rales.  Musculoskeletal:     Cervical back: Normal range of motion and neck supple.  Lymphadenopathy:     Cervical: No cervical adenopathy.  Skin:    General: Skin is warm and dry.     Capillary Refill: Capillary refill takes less than 2 seconds.  Neurological:     General: No focal deficit present.     Mental Status: She is alert and oriented to person, place, and time.      UC Treatments / Results  Labs (all labs ordered are listed, but only abnormal results are displayed) Labs Reviewed - No data to display  EKG   Radiology No results found.  Procedures Procedures (including critical care time)  Medications Ordered in UC Medications - No data to display  Initial Impression / Assessment and Plan / UC Course  I have reviewed the triage vital signs and the nursing notes.  Pertinent labs & imaging results that were available during my care of the patient were reviewed by me and considered in my medical decision making (see chart for details).   Patient is a pleasant, nontoxic-appearing 67 year old female presenting for evaluation of 1 week worth of respiratory symptoms as outlined in HPI above.  Her exam does reveal inflamed nasal mucosa with yellow discharge in both nares as well as yellow postnasal drip.  Cardiopulmonary exam is benign.  Her exam is consistent with an upper respiratory infection.  Given the fact that she has had symptoms for a week and they have not shown any improvement trial of  antibiotics is warranted.  I will start her on Augmentin 875 twice daily for 10 days.  I have also prescribed Atrovent nasal spray to help with the congestion and Tessalon Perles Promethazine DM  cough syrup for cough and congestion.  Return precautions reviewed.   Final Clinical Impressions(s) / UC Diagnoses   Final diagnoses:  Upper respiratory tract infection, unspecified type  Acute cough     Discharge Instructions      The Augmentin twice daily with food for 10 days for treatment of your URI.  Perform sinus irrigation 2-3 times a day with a [employer] sinus rinse kit and distilled water.  Do not use tap water.  You can use plain over-the-counter Mucinex every 6 hours to break up the stickiness of the mucus so your body can clear it.  Increase your oral fluid intake to thin out your mucus so that is also able for your body to clear more easily.  Take an over-the-counter probiotic, such as Culturelle-align-activia, 1 hour after each dose of antibiotic to prevent diarrhea.  Use the Atrovent nasal spray, 2 squirts in each nostril every 6 hours, as needed for runny nose and postnasal drip.  Use the Tessalon Perles every 8 hours during the day.  Take them with a small sip of water.  They may give you some numbness to the base of your tongue or a metallic taste in your mouth, this is normal.  Use the Promethazine DM cough syrup at bedtime for cough and congestion.  It will make you drowsy so do not take it during the day.  If you develop any new or worsening symptoms return for reevaluation or see your primary care provider.      ED Prescriptions     Medication Sig Dispense Auth. Provider   amoxicillin-clavulanate (AUGMENTIN) 875-125 MG tablet Take 1 tablet by mouth every 12 (twelve) hours for 10 days. 20 tablet Margarette Canada, NP   benzonatate (TESSALON) 100 MG capsule Take 2 capsules (200 mg total) by mouth every 8 (eight) hours. 21 capsule Margarette Canada, NP   ipratropium (ATROVENT)  0.06 % nasal spray Place 2 sprays into both nostrils 4 (four) times daily. 15 mL Margarette Canada, NP   promethazine-dextromethorphan (PROMETHAZINE-DM) 6.25-15 MG/5ML syrup Take 5 mLs by mouth 4 (four) times daily as needed. 118 mL Margarette Canada, NP      PDMP not reviewed this encounter.   Margarette Canada, NP 04/11/22 1217

## 2022-04-13 ENCOUNTER — Encounter: Payer: Self-pay | Admitting: Emergency Medicine

## 2022-04-13 ENCOUNTER — Ambulatory Visit
Admission: EM | Admit: 2022-04-13 | Discharge: 2022-04-13 | Disposition: A | Discharge status: Home / Self Care | Payer: 59 | Attending: Physician Assistant | Admitting: Physician Assistant

## 2022-04-13 DIAGNOSIS — T7840XA Allergy, unspecified, initial encounter: Secondary | ICD-10-CM | POA: Diagnosis not present

## 2022-04-13 DIAGNOSIS — L299 Pruritus, unspecified: Secondary | ICD-10-CM | POA: Diagnosis not present

## 2022-04-13 DIAGNOSIS — R21 Rash and other nonspecific skin eruption: Secondary | ICD-10-CM | POA: Diagnosis not present

## 2022-04-13 DIAGNOSIS — J029 Acute pharyngitis, unspecified: Secondary | ICD-10-CM | POA: Diagnosis not present

## 2022-04-13 MED ORDER — AZITHROMYCIN 250 MG PO TABS: 250.0000 mg | ORAL_TABLET | Freq: Every day | ORAL | 0 refills | Status: AC

## 2022-04-13 MED ORDER — PREDNISONE 10 MG PO TABS: ORAL_TABLET | ORAL | 0 refills | Status: AC

## 2022-04-13 MED ORDER — HYDROXYZINE HCL 25 MG PO TABS: 25.0000 mg | ORAL_TABLET | Freq: Four times a day (QID) | ORAL | 0 refills | Status: AC | PRN

## 2022-04-13 NOTE — ED Provider Notes (Signed)
[facility]    CSN: [id number] Arrival date & time: 04/13/22  [id number]      History   Chief Complaint Chief Complaint  Patient presents with   Rash   Pruritis    HPI Autumn Robles is a 67 y.o. female presenting for pruritic rash of chest and neck that began yesterday. Also reports pruritus all over.  She was seen here 2 days ago for URI.  She was started on Augmentin at that time.  Other medications given include benzonatate, Promethazine DM and Atrovent nasal spray. Patient denies any history of allergies to any of these medications.  She last took the Augmentin yesterday.  She is using nasal spray today.  She denies any associated swelling, difficulty swallowing or breathing.  She reports that her sore throat was previously 8 out of 10 after starting antibiotics significantly improved and now is approximately 2 out of 10.  She was never tested for strep the other day but was told it was a possibility.  She has not had any fevers.  Continues to have mild cough.  Has used [employer] cream for symptoms.  Not taking any antihistamines.  No other complaints.  HPI  Past Medical History:  Diagnosis Date   Hypertension     There are no problems to display for this patient.   Past Surgical History:  Procedure Laterality Date   ABDOMINAL HYSTERECTOMY     BREAST EXCISIONAL BIOPSY Left 1995   benign   CHOLECYSTECTOMY      OB History   No obstetric history on file.      Home Medications    Prior to Admission medications   Medication Sig Start Date End Date Taking? Authorizing Provider  azithromycin (ZITHROMAX) 250 MG tablet Take 1 tablet (250 mg total) by mouth daily. Take first 2 tablets together, then 1 every day until finished. 04/13/22  Yes Danton Clap, PA-C  hydrOXYzine (ATARAX) 25 MG tablet Take 1 tablet (25 mg total) by mouth every 6 (six) hours as needed for itching. 04/13/22  Yes Danton Clap, PA-C  predniSONE (DELTASONE) 10 MG tablet Take 6 tabs po on day 1  and decrease by 1 tab daily until complet 04/13/22  Yes Danton Clap, PA-C  hydrochlorothiazide (HYDRODIURIL) 25 MG tablet Take 25 mg by mouth daily.    [provider]  ipratropium (ATROVENT) 0.06 % nasal spray Place 2 sprays into both nostrils 4 (four) times daily. 04/11/22   Margarette Canada, NP  losartan (COZAAR) 25 MG tablet Take 25 mg by mouth daily.    [provider]  meloxicam (MOBIC) 15 MG tablet Take 1 tablet (15 mg total) by mouth daily. 06/12/15   Triplett, Cari B, FNP  traMADol (ULTRAM) 50 MG tablet Take 1 tablet (50 mg total) by mouth every 6 (six) hours as needed. 06/12/15   Victorino Dike, FNP    Family History Family History  Problem Relation Age of Onset   Breast cancer Neg Hx     Social History Social History   Tobacco Use   Smoking status: Never  Vaping Use   Vaping Use: Never used  Substance Use Topics   Alcohol use: No   Drug use: No     Allergies   Patient has no known allergies.   Review of Systems Review of Systems  Constitutional:  Negative for chills, diaphoresis, fatigue and fever.  HENT:  Positive for congestion, rhinorrhea and sore throat. Negative for ear pain, sinus pressure, sinus  pain and trouble swallowing.   Respiratory:  Positive for cough. Negative for shortness of breath and wheezing.   Cardiovascular:  Negative for chest pain.  Gastrointestinal:  Negative for abdominal pain, nausea and vomiting.  Musculoskeletal:  Negative for arthralgias and myalgias.  Skin:  Positive for rash.  Neurological:  Negative for weakness and headaches.  Hematological:  Negative for adenopathy.     Physical Exam Triage Vital Signs ED Triage Vitals  Enc Vitals Group     BP      Pulse      Resp      Temp      Temp src      SpO2      Weight      Height      Head Circumference      Peak Flow      Pain Score      Pain Loc      Pain Edu?      Excl. in [place]?    No data found.  Updated Vital Signs BP 124/81 (BP Location: Left Arm)    Pulse 89   Temp 98.6 F (37 C) (Oral)   Resp 16   SpO2 99%      Physical Exam Vitals and nursing note reviewed.  Constitutional:      General: She is not in acute distress.    Appearance: Normal appearance. She is not ill-appearing or toxic-appearing.  HENT:     Head: Normocephalic and atraumatic.     Nose: Nose normal.     Mouth/Throat:     Mouth: Mucous membranes are moist.     Pharynx: Oropharynx is clear. Posterior oropharyngeal erythema (mild) present.  Eyes:     General: No scleral icterus.       Right eye: No discharge.        Left eye: No discharge.     Conjunctiva/sclera: Conjunctivae normal.  Cardiovascular:     Rate and Rhythm: Normal rate and regular rhythm.     Heart sounds: Normal heart sounds.  Pulmonary:     Effort: Pulmonary effort is normal. No respiratory distress.     Breath sounds: Normal breath sounds.  Musculoskeletal:     Cervical back: Neck supple.  Skin:    General: Skin is dry.     Findings: Rash (erythematous maculopaular rash of neck and upper chest. Patient scratching at her thighs) present.  Neurological:     General: No focal deficit present.     Mental Status: She is alert. Mental status is at baseline.     Motor: No weakness.     Gait: Gait normal.  Psychiatric:        Mood and Affect: Mood normal.        Behavior: Behavior normal.        Thought Content: Thought content normal.      UC Treatments / Results  Labs (all labs ordered are listed, but only abnormal results are displayed) Labs Reviewed - No data to display  EKG   Radiology No results found.  Procedures Procedures (including critical care time)  Medications Ordered in UC Medications - No data to display  Initial Impression / Assessment and Plan / UC Course  I have reviewed the triage vital signs and the nursing notes.  Pertinent labs & imaging results that were available during my care of the patient were reviewed by me and considered in my medical  decision making (see chart for details).   67 year old female  presents for rash of neck and upper chest as well as pruritus throughout body that began yesterday.  She was seen here 2 days ago and treated with Augmentin, Promethazine DM, benzonatate and Atrovent nasal spray for URI and cough.  No testing was performed at the time of her visit.  She reports that her sore throat significantly improved after starting antibiotics.  She denies any known history of any medications whatsoever.  Denies any new foods or other possibilities that could have caused the rash.  Vitals normal and stable.  Patient overall well-appearing.  On exam she has mild posterior pharyngeal erythema.  No associated facial or intraoral swelling.  Mild erythematous maculopapular rash of neck and upper chest.  Patient vigorously scratching at her thighs.  Unsure as to the cause of her rash but likely related to allergy to one of the medication she was prescribed.  Will have her discontinue Augmentin, Promethazine DM and benzonatate.  Since she has significant improvement in her sore throat after starting the antibiotics we will switch her to azithromycin.  No strep testing done a couple days ago but this could be a possibility since she had significant improvement in her symptoms.  Also sent hydroxyzine and prednisone.  Encouraged increasing rest and fluids.  Reviewed return and ED precautions.   Final Clinical Impressions(s) / UC Diagnoses   Final diagnoses:  Rash and nonspecific skin eruption  Acute pharyngitis, unspecified etiology  Pruritic condition  Allergic reaction, initial encounter     Discharge Instructions      -You are likely having an allergic reaction to one of the medications you were prescribed 2 days ago. Stop using Augmentin, promethazine DM and benzonatate.  -Since you did have significant improvement in your sore throat once you start the Augmentin I will send a completely different antibiotic for you  to still cover the possibility of strep especially since you were tested today.  Start azithromycin. - For the allergic reaction I have sent a medication called hydroxyzine which can make you little drowsy.  Will help with the itching.  I also sent prednisone taper. - If any point the rash worsens or you develop associated swelling or breathing difficulty, call 911 or go to ER. - Symptoms should be improving significantly over the next couple days.     ED Prescriptions     Medication Sig Dispense Auth. Provider   azithromycin (ZITHROMAX) 250 MG tablet Take 1 tablet (250 mg total) by mouth daily. Take first 2 tablets together, then 1 every day until finished. 6 tablet Laurene Footman B, PA-C   hydrOXYzine (ATARAX) 25 MG tablet Take 1 tablet (25 mg total) by mouth every 6 (six) hours as needed for itching. 30 tablet Laurene Footman B, PA-C   predniSONE (DELTASONE) 10 MG tablet Take 6 tabs po on day 1 and decrease by 1 tab daily until complet 21 tablet Danton Clap, PA-C      PDMP not reviewed this encounter.   Danton Clap, PA-C 04/13/22 [phone]

## 2022-04-13 NOTE — Discharge Instructions (Addendum)
-  You are likely having an allergic reaction to one of the medications you were prescribed 2 days ago. Stop using Augmentin, promethazine DM and benzonatate.  -Since you did have significant improvement in your sore throat once you start the Augmentin I will send a completely different antibiotic for you to still cover the possibility of strep especially since you were tested today.  Start azithromycin. - For the allergic reaction I have sent a medication called hydroxyzine which can make you little drowsy.  Will help with the itching.  I also sent prednisone taper. - If any point the rash worsens or you develop associated swelling or breathing difficulty, call 911 or go to ER. - Symptoms should be improving significantly over the next couple days.

## 2022-04-13 NOTE — ED Triage Notes (Signed)
Pt was seen on 04/11/22 for cold sxs she was prescribed antibiotics and cough suppressants. The next day she developed and rash and itching all over.
